# Patient Record
Sex: Male | Born: 1995 | Race: Black or African American | Hispanic: No | Smoking: Former smoker
Health system: Southern US, Community
[De-identification: ages and names within clinical notes are randomized; demographics above are authoritative.]

## PROBLEM LIST (undated history)

## (undated) ENCOUNTER — Emergency Department (HOSPITAL_COMMUNITY): Admission: EM | Payer: Self-pay | Source: Home / Self Care

## (undated) DIAGNOSIS — R51 Headache: Secondary | ICD-10-CM

## (undated) DIAGNOSIS — H539 Unspecified visual disturbance: Secondary | ICD-10-CM

## (undated) DIAGNOSIS — J45909 Unspecified asthma, uncomplicated: Secondary | ICD-10-CM

## (undated) DIAGNOSIS — K219 Gastro-esophageal reflux disease without esophagitis: Secondary | ICD-10-CM

## (undated) DIAGNOSIS — F419 Anxiety disorder, unspecified: Secondary | ICD-10-CM

## (undated) DIAGNOSIS — F909 Attention-deficit hyperactivity disorder, unspecified type: Secondary | ICD-10-CM

## (undated) DIAGNOSIS — R109 Unspecified abdominal pain: Secondary | ICD-10-CM

## (undated) DIAGNOSIS — T7840XA Allergy, unspecified, initial encounter: Secondary | ICD-10-CM

## (undated) HISTORY — DX: Gastro-esophageal reflux disease without esophagitis: K21.9

## (undated) HISTORY — DX: Unspecified abdominal pain: R10.9

---
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
## 2014-10-13 ENCOUNTER — Encounter (HOSPITAL_COMMUNITY): Payer: Self-pay | Admitting: Emergency Medicine

## 2014-10-13 ENCOUNTER — Emergency Department (INDEPENDENT_AMBULATORY_CARE_PROVIDER_SITE_OTHER): Payer: Medicaid Other

## 2014-10-13 ENCOUNTER — Emergency Department (INDEPENDENT_AMBULATORY_CARE_PROVIDER_SITE_OTHER)
Admission: EM | Admit: 2014-10-13 | Discharge: 2014-10-13 | Disposition: A | Discharge status: Home / Self Care | Payer: Medicaid Other | Source: Home / Self Care | Attending: Family Medicine | Admitting: Family Medicine

## 2014-10-13 DIAGNOSIS — J40 Bronchitis, not specified as acute or chronic: Secondary | ICD-10-CM

## 2014-10-13 MED ORDER — ALBUTEROL SULFATE HFA 108 (90 BASE) MCG/ACT IN AERS: 2.0000 | INHALATION_SPRAY | Freq: Four times a day (QID) | RESPIRATORY_TRACT | Status: DC | PRN

## 2014-10-13 MED ORDER — IPRATROPIUM-ALBUTEROL 0.5-2.5 (3) MG/3ML IN SOLN
RESPIRATORY_TRACT | Status: AC
  Filled 2014-10-13: qty 3

## 2014-10-13 MED ORDER — IPRATROPIUM-ALBUTEROL 0.5-2.5 (3) MG/3ML IN SOLN
3.0000 mL | Freq: Once | RESPIRATORY_TRACT | Status: AC
  Administered 2014-10-13: 3 mL via RESPIRATORY_TRACT

## 2014-10-13 MED ORDER — PREDNISONE 10 MG PO TABS: 30.0000 mg | ORAL_TABLET | Freq: Every day | ORAL | Status: DC

## 2014-10-13 NOTE — ED Notes (Signed)
C/o cold sx onset 6 days Sx include chest tightness, prod cough, chills, left ear pain Denies fevers Taking OTC cold meds  Alert, no signs of acute distress.

## 2014-10-13 NOTE — Discharge Instructions (Signed)
Thank you for coming in today. Take albuterol as needed. Take prednisone daily for 5 days. Return as needed.   Acute Bronchitis Bronchitis is inflammation of the airways that extend from the windpipe into the lungs (bronchi). The inflammation often causes mucus to develop. This leads to a cough, which is the most common symptom of bronchitis.  In acute bronchitis, the condition usually develops suddenly and goes away over time, usually in a couple weeks. Smoking, allergies, and asthma can make bronchitis worse. Repeated episodes of bronchitis may cause further lung problems.  CAUSES Acute bronchitis is most often caused by the same virus that causes a cold. The virus can spread from person to person (contagious) through coughing, sneezing, and touching contaminated objects. SIGNS AND SYMPTOMS   Cough.   Fever.   Coughing up mucus.   Body aches.   Chest congestion.   Chills.   Shortness of breath.   Sore throat.  DIAGNOSIS  Acute bronchitis is usually diagnosed through a physical exam. Your health care provider will also ask you questions about your medical history. Tests, such as chest X-rays, are sometimes done to rule out other conditions.  TREATMENT  Acute bronchitis usually goes away in a couple weeks. Oftentimes, no medical treatment is necessary. Medicines are sometimes given for relief of fever or cough. Antibiotic medicines are usually not needed but may be prescribed in certain situations. In some cases, an inhaler may be recommended to help reduce shortness of breath and control the cough. A cool mist vaporizer may also be used to help thin bronchial secretions and make it easier to clear the chest.  HOME CARE INSTRUCTIONS  Get plenty of rest.   Drink enough fluids to keep your urine clear or pale yellow (unless you have a medical condition that requires fluid restriction). Increasing fluids may help thin your respiratory secretions (sputum) and reduce chest  congestion, and it will prevent dehydration.   Take medicines only as directed by your health care provider.  If you were prescribed an antibiotic medicine, finish it all even if you start to feel better.  Avoid smoking and secondhand smoke. Exposure to cigarette smoke or irritating chemicals will make bronchitis worse. If you are a smoker, consider using nicotine gum or skin patches to help control withdrawal symptoms. Quitting smoking will help your lungs heal faster.   Reduce the chances of another bout of acute bronchitis by washing your hands frequently, avoiding people with cold symptoms, and trying not to touch your hands to your mouth, nose, or eyes.   Keep all follow-up visits as directed by your health care provider.  SEEK MEDICAL CARE IF: Your symptoms do not improve after 1 week of treatment.  SEEK IMMEDIATE MEDICAL CARE IF:  You develop an increased fever or chills.   You have chest pain.   You have severe shortness of breath.  You have bloody sputum.   You develop dehydration.  You faint or repeatedly feel like you are going to pass out.  You develop repeated vomiting.  You develop a severe headache. MAKE SURE YOU:   Understand these instructions.  Will watch your condition.  Will get help right away if you are not doing well or get worse. Document Released: 09/20/2004 Document Revised: 12/28/2013 Document Reviewed: 02/03/2013 [facility] Patient Information 2015 [place]Care, [place]. This information is not intended to replace advice given to you by your health care provider. Make sure you discuss any questions you have with your health care provider.

## 2014-10-13 NOTE — ED Provider Notes (Signed)
Sehaj Lendell CapriceCoker is a 19 y.o. male who presents to Urgent Care today for cough congestion chest tightness and mild wheezing present for about 5 days. Patient also notes left-sided ear pain. No vomiting or diarrhea. Cough is mildly productive. Over-the-counter medications have not helped much. Patient has a recent history of intentional overdose.   Past Medical History  Diagnosis Date  . Abdominal pain, recurrent   . Gastroesophageal reflux   . Asthma   . Anxiety   . Vision abnormalities     Wears glasses for reading  . ADHD (attention deficit hyperactivity disorder)   . Allergy   . Headache(784.0)     Takes Neurontin for miagraines   History reviewed. No pertinent past surgical history. History  Substance Use Topics  . Smoking status: Never Smoker   . Smokeless tobacco: Never Used  . Alcohol Use: No   ROS as above Medications: No current facility-administered medications for this encounter.   Current Outpatient Prescriptions  Medication Sig Dispense Refill  . albuterol (PROVENTIL HFA;VENTOLIN HFA) 108 (90 BASE) MCG/ACT inhaler Inhale 2 puffs into the lungs every 6 (six) hours as needed for wheezing or shortness of breath. 1 Inhaler 2  . gabapentin (NEURONTIN) 300 MG capsule Take 600 mg by mouth 2 (two) times daily.    Marland Kitchen. ibuprofen (ADVIL,MOTRIN) 200 MG tablet Take 200 mg by mouth every 6 (six) hours as needed for headache.    . mirtazapine (REMERON) 15 MG tablet Take 1 tablet (15 mg total) by mouth at bedtime. 30 tablet 1  . predniSONE (DELTASONE) 10 MG tablet Take 3 tablets (30 mg total) by mouth daily. 15 tablet 0  . risperiDONE (RISPERDAL) 1 MG tablet Take 1 mg by mouth at bedtime.     Allergies  Allergen Reactions  . Food Hives    Many: apple, watermelon, peach, corn, corn starch, cherries REACTION: ANAPHYLAXIS, soy, soy products, almonds, peanuts, hazel nuts, celery, carrots.      Exam:  BP 120/72 mmHg  Pulse 76  Temp(Src) 98.2 F (36.8 C) (Oral)  Resp 20  SpO2 94%   Oxygen saturation was rechecked and found to be 95% on room air Gen: Well NAD HEENT: EOMI,  MMM posterior pharynx with mild cobblestoning. Normal tympanic membranes bilaterally Lungs: Normal work of breathing. Coarse breath sounds bilaterally Heart: RRR no MRG Abd: NABS, Soft. Nondistended, Nontender Exts: Brisk capillary refill, warm and well perfused.   Patient was given a 2.5/0.5 mg DuoNeb nebulizer treatment, and felt better  No results found for this or any previous visit (from the past 24 hour(s)). Dg Chest 2 View  10/13/2014   CLINICAL DATA:  Cold, congestion, chest tightness  EXAM: CHEST  2 VIEW  COMPARISON:  04/19/2011  FINDINGS: Lungs are clear.  No pleural effusion or pneumothorax.  The heart is normal in size.  Visualized osseous structures are within normal limits.  IMPRESSION: Normal chest radiographs.   Electronically Signed   By: Charline BillsSriyesh  Krishnan M.D.   On: 10/13/2014 16:50    Assessment and Plan: 19 y.o. male with bronchitis.  Treat with prednisone and albuterol. Return as needed.  Discussed warning signs or symptoms. Please see discharge instructions. Patient expresses understanding.     Rodolph BongEvan S Corey, MD 10/13/14 [phone]

## 2015-07-25 IMAGING — DX DG CHEST 2V
2 series · 2 of 2 positions shown · non-contrast
Comparison: 04/19/2011

CLINICAL DATA: Cold, congestion, chest tightness

EXAM:
CHEST  2 VIEW

[chest lat (1 of 2)]
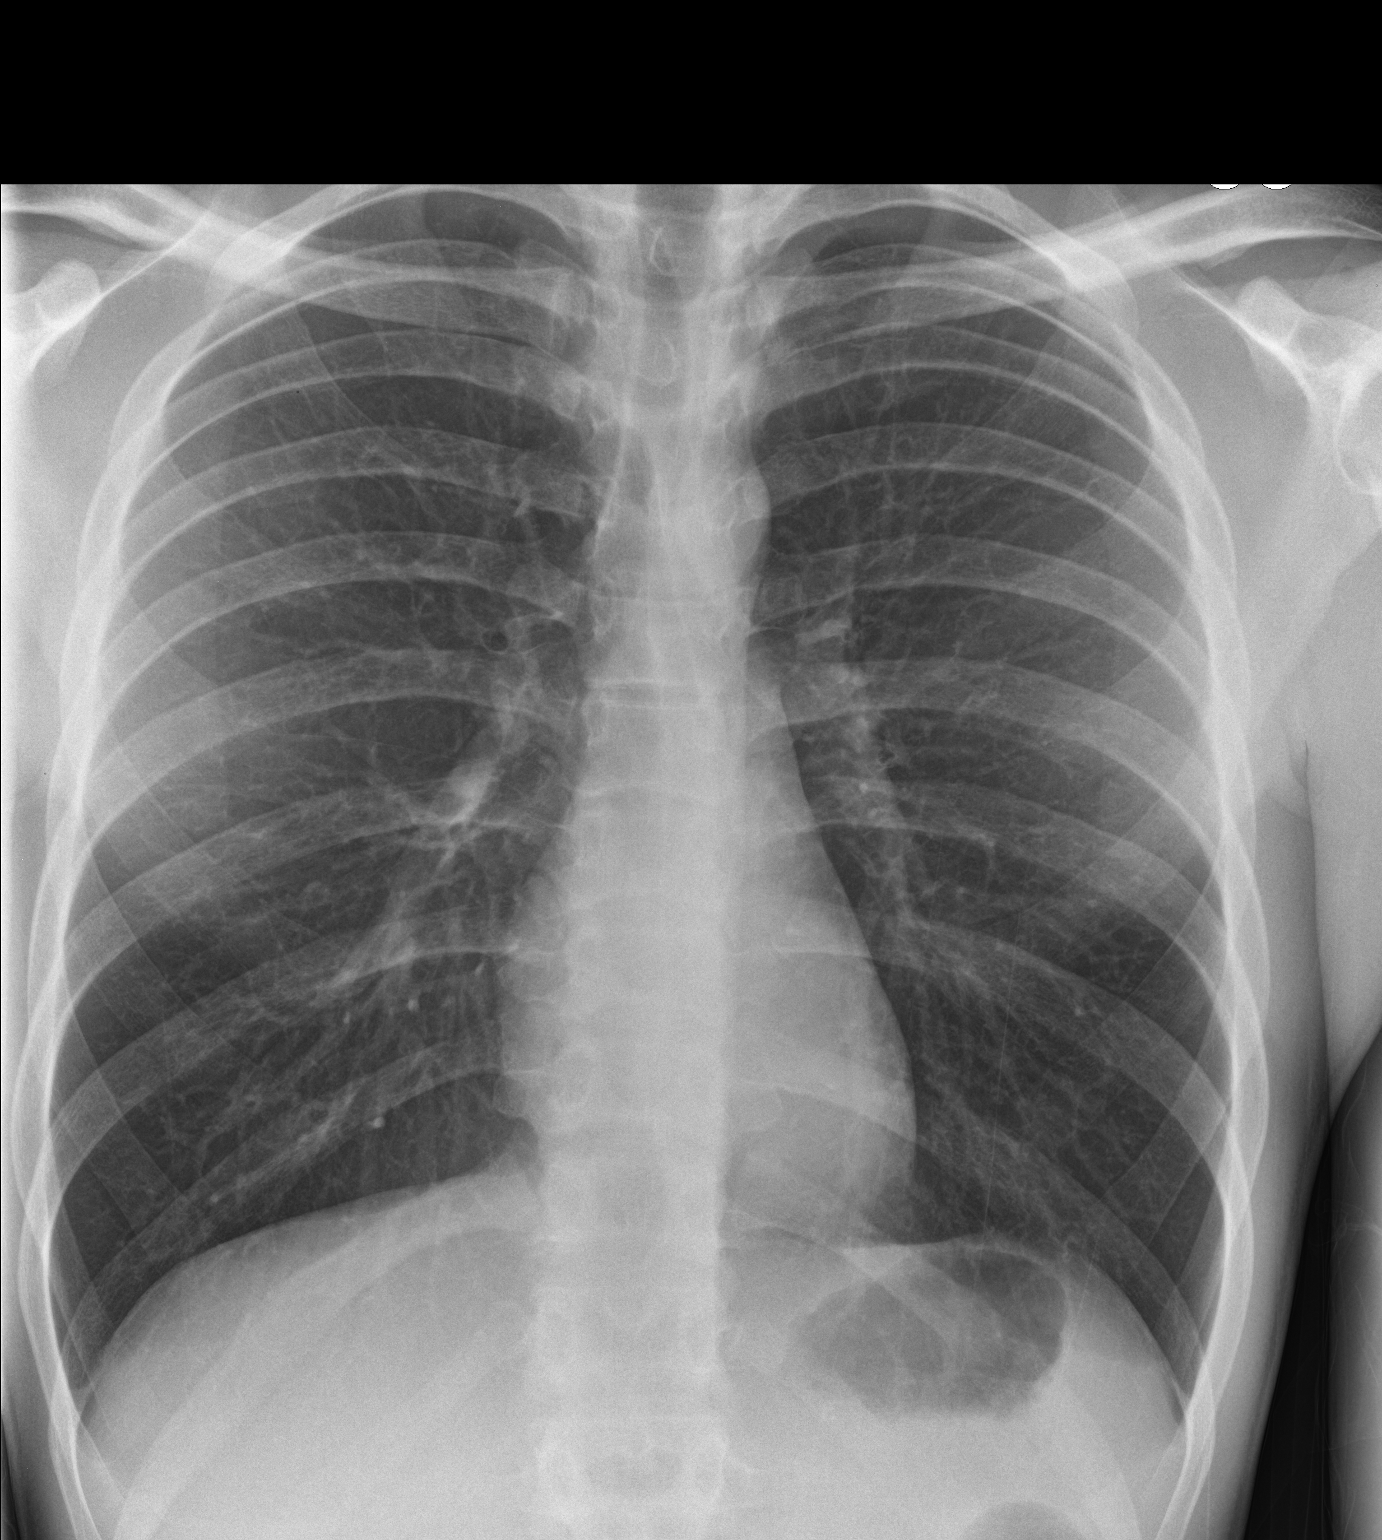

[chest lat (2 of 2)]
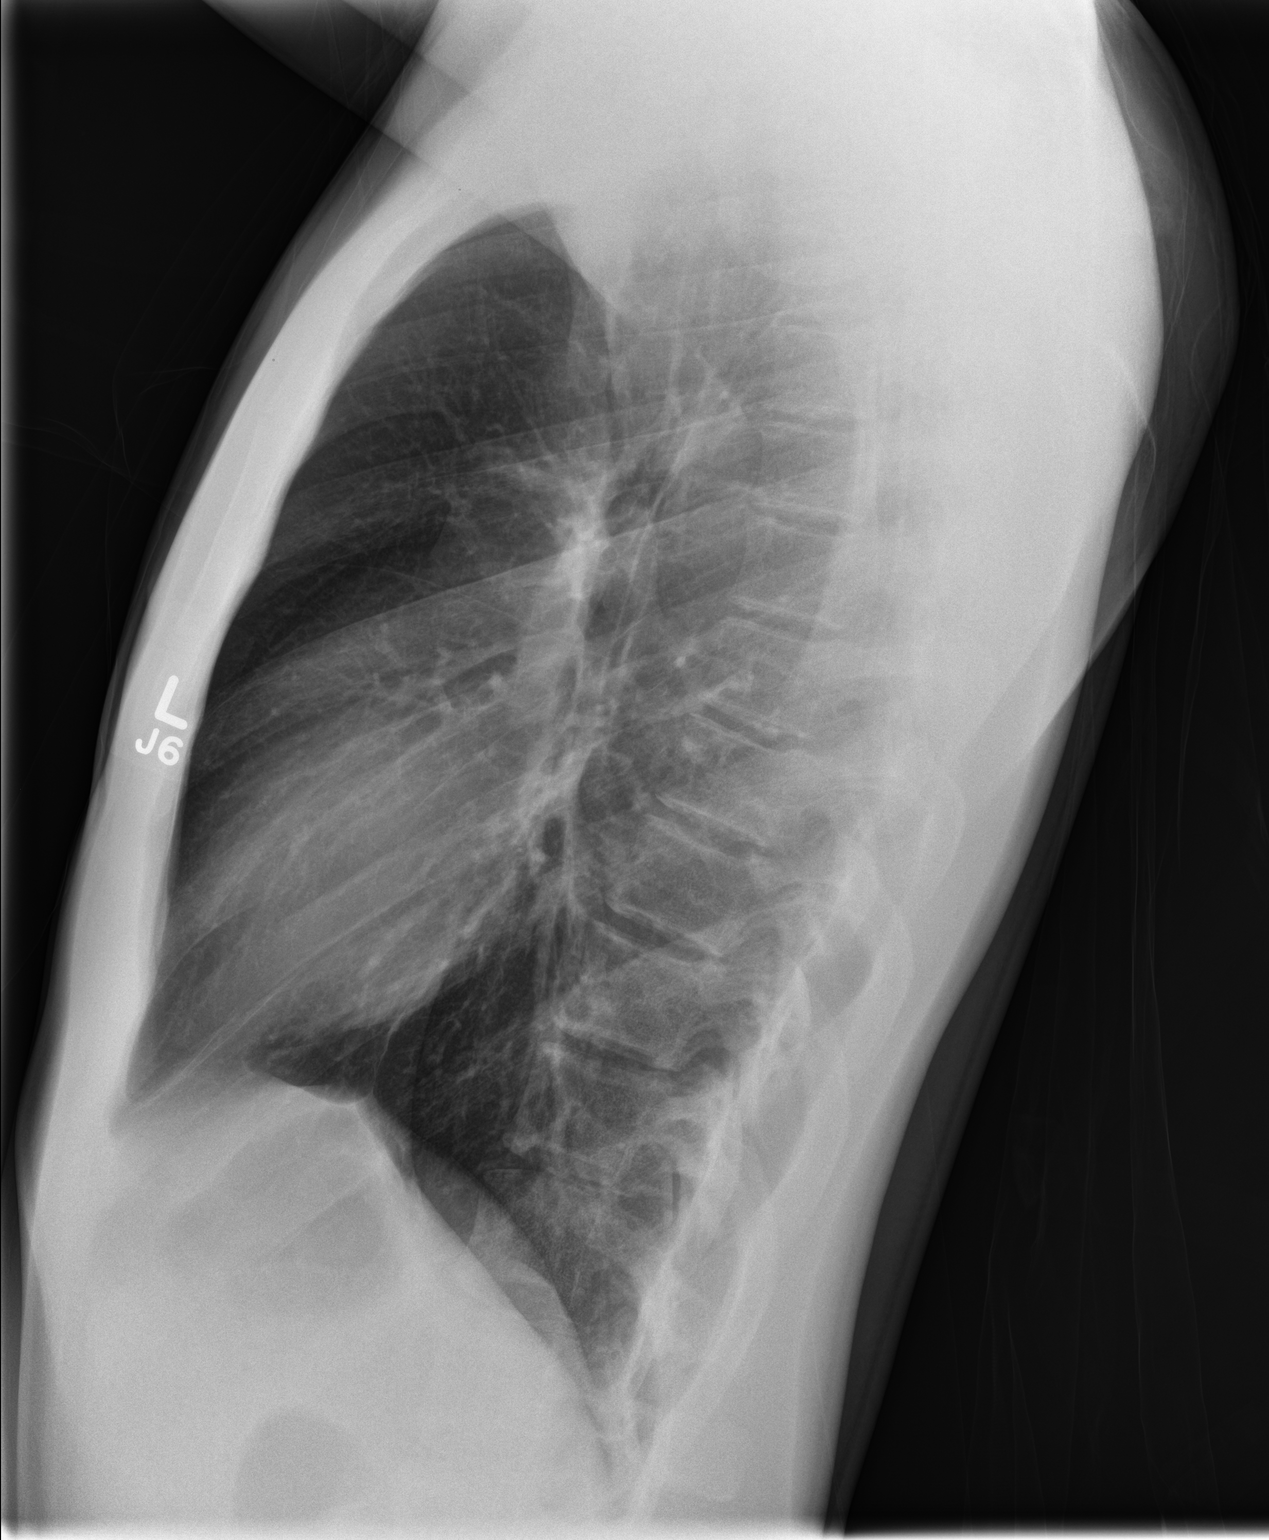

[2 of 2 positions shown; findings below may reference images not displayed]

FINDINGS: Lungs are clear.  No pleural effusion or pneumothorax.

The heart is normal in size.

Visualized osseous structures are within normal limits.
IMPRESSION: Normal chest radiographs.

## 2015-11-07 ENCOUNTER — Emergency Department
Admission: EM | Admit: 2015-11-07 | Discharge: 2015-11-07 | Disposition: A | Discharge status: Home / Self Care | Payer: Medicaid Other | Attending: Emergency Medicine | Admitting: Emergency Medicine

## 2015-11-07 ENCOUNTER — Encounter: Payer: Self-pay | Admitting: Emergency Medicine

## 2015-11-07 DIAGNOSIS — Z79899 Other long term (current) drug therapy: Secondary | ICD-10-CM | POA: Diagnosis not present

## 2015-11-07 DIAGNOSIS — K2970 Gastritis, unspecified, without bleeding: Secondary | ICD-10-CM | POA: Insufficient documentation

## 2015-11-07 DIAGNOSIS — R1012 Left upper quadrant pain: Secondary | ICD-10-CM

## 2015-11-07 DIAGNOSIS — R1030 Lower abdominal pain, unspecified: Secondary | ICD-10-CM | POA: Diagnosis present

## 2015-11-07 LAB — COMPREHENSIVE METABOLIC PANEL
ALBUMIN: 4.9 g/dL (ref 3.5–5.0)
ALK PHOS: 71 U/L (ref 38–126)
ALT: 16 U/L — ABNORMAL LOW (ref 17–63)
ANION GAP: 5 (ref 5–15)
AST: 23 U/L (ref 15–41)
BILIRUBIN TOTAL: 1.2 mg/dL (ref 0.3–1.2)
BUN: 23 mg/dL — AB (ref 6–20)
CO2: 30 mmol/L (ref 22–32)
Calcium: 9.2 mg/dL (ref 8.9–10.3)
Chloride: 104 mmol/L (ref 101–111)
Creatinine, Ser: 0.84 mg/dL (ref 0.61–1.24)
GFR calc Af Amer: >60 mL/min (ref >60)
GFR calc non Af Amer: >60 mL/min (ref >60)
GLUCOSE: 139 mg/dL — AB (ref 65–99)
Potassium: 4.1 mmol/L (ref 3.5–5.1)
Sodium: 139 mmol/L (ref 135–145)
TOTAL PROTEIN: 7.3 g/dL (ref 6.5–8.1)

## 2015-11-07 LAB — URINALYSIS COMPLETE WITH MICROSCOPIC (ARMC ONLY)
BILIRUBIN URINE: NEGATIVE
Bacteria, UA: NONE SEEN
Glucose, UA: 50 mg/dL — AB
HGB URINE DIPSTICK: NEGATIVE
KETONES UR: NEGATIVE mg/dL
LEUKOCYTES UA: NEGATIVE
Nitrite: NEGATIVE
PH: 5 (ref 5.0–8.0)
Protein, ur: NEGATIVE mg/dL
SPECIFIC GRAVITY, URINE: 1.044 — AB (ref 1.005–1.030)

## 2015-11-07 LAB — LIPASE, BLOOD: Lipase: 21 U/L (ref 11–51)

## 2015-11-07 LAB — CBC
HCT: 44.8 % (ref 40.0–52.0)
HEMOGLOBIN: 15 g/dL (ref 13.0–18.0)
MCH: 26.1 pg (ref 26.0–34.0)
MCHC: 33.5 g/dL (ref 32.0–36.0)
MCV: 78.1 fL — AB (ref 80.0–100.0)
PLATELETS: 172 10*3/uL (ref 150–440)
RBC: 5.73 MIL/uL (ref 4.40–5.90)
RDW: 15 % — ABNORMAL HIGH (ref 11.5–14.5)
WBC: 3.7 10*3/uL — ABNORMAL LOW (ref 3.8–10.6)

## 2015-11-07 MED ORDER — FAMOTIDINE 40 MG PO TABS: 40.0000 mg | ORAL_TABLET | Freq: Every evening | ORAL | Status: DC

## 2015-11-07 MED ORDER — GI COCKTAIL ~~LOC~~
30.0000 mL | Freq: Once | ORAL | Status: AC
  Administered 2015-11-07: 30 mL via ORAL
  Filled 2015-11-07: qty 30

## 2015-11-07 MED ORDER — SODIUM CHLORIDE 0.9 % IV BOLUS (SEPSIS)
1000.0000 mL | Freq: Once | INTRAVENOUS | Status: AC
  Administered 2015-11-07: 1000 mL via INTRAVENOUS

## 2015-11-07 NOTE — ED Notes (Signed)
Pt states one week of lower bilateral quadrant abdominal pain that radiates to flanks with associated nausea. Pt denies vomiting, diarrhea, or fever.

## 2015-11-07 NOTE — Discharge Instructions (Signed)
Abdominal Pain, Adult °Many things can cause abdominal pain. Usually, abdominal pain is not caused by a disease and will improve without treatment. It can often be observed and treated at home. Your health care provider will do a physical exam and possibly order blood tests and X-rays to help determine the seriousness of your pain. However, in many cases, more time must pass before a clear cause of the pain can be found. Before that point, your health care provider may not know if you need more testing or further treatment. °HOME CARE INSTRUCTIONS °Monitor your abdominal pain for any changes. The following actions may help to alleviate any discomfort you are experiencing: °· Only take over-the-counter or prescription medicines as directed by your health care provider. °· Do not take laxatives unless directed to do so by your health care provider. °· Try a clear liquid diet (broth, tea, or water) as directed by your health care provider. Slowly move to a bland diet as tolerated. °SEEK MEDICAL CARE IF: °· You have unexplained abdominal pain. °· You have abdominal pain associated with nausea or diarrhea. °· You have pain when you urinate or have a bowel movement. °· You experience abdominal pain that wakes you in the night. °· You have abdominal pain that is worsened or improved by eating food. °· You have abdominal pain that is worsened with eating fatty foods. °· You have a fever. °SEEK IMMEDIATE MEDICAL CARE IF: °· Your pain does not go away within 2 hours. °· You keep throwing up (vomiting). °· Your pain is felt only in portions of the abdomen, such as the right side or the left lower portion of the abdomen. °· You pass bloody or black tarry stools. °MAKE SURE YOU: °· Understand these instructions. °· Will watch your condition. °· Will get help right away if you are not doing well or get worse. °  °This information is not intended to replace advice given to you by your health care provider. Make sure you discuss  any questions you have with your health care provider. °  °Document Released: 05/23/2005 Document Revised: 05/04/2015 Document Reviewed: 04/22/2013 °Elsevier Interactive Patient Education ©2016 [employer] Inc. ° °Gastritis, Adult °Gastritis is soreness and swelling (inflammation) of the lining of the stomach. Gastritis can develop as a sudden onset (acute) or long-term (chronic) condition. If gastritis is not treated, it can lead to stomach bleeding and ulcers. °CAUSES  °Gastritis occurs when the stomach lining is weak or damaged. Digestive juices from the stomach then inflame the weakened stomach lining. The stomach lining may be weak or damaged due to viral or bacterial infections. One common bacterial infection is the Helicobacter pylori infection. Gastritis can also result from excessive alcohol consumption, taking certain medicines, or having too much acid in the stomach.  °SYMPTOMS  °In some cases, there are no symptoms. When symptoms are present, they may include: °· Pain or a burning sensation in the upper abdomen. °· Nausea. °· Vomiting. °· An uncomfortable feeling of fullness after eating. °DIAGNOSIS  °Your caregiver may suspect you have gastritis based on your symptoms and a physical exam. To determine the cause of your gastritis, your caregiver may perform the following: °· Blood or stool tests to check for the H pylori bacterium. °· Gastroscopy. A thin, flexible tube (endoscope) is passed down the esophagus and into the stomach. The endoscope has a light and camera on the end. Your caregiver uses the endoscope to view the inside of the stomach. °· Taking a tissue sample (  biopsy) from the stomach to examine under a microscope. °TREATMENT  °Depending on the cause of your gastritis, medicines may be prescribed. If you have a bacterial infection, such as an H pylori infection, antibiotics may be given. If your gastritis is caused by too much acid in the stomach, H2 blockers or antacids may be given. Your  caregiver may recommend that you stop taking aspirin, ibuprofen, or other nonsteroidal anti-inflammatory drugs (NSAIDs). °HOME CARE INSTRUCTIONS °· Only take over-the-counter or prescription medicines as directed by your caregiver. °· If you were given antibiotic medicines, take them as directed. Finish them even if you start to feel better. °· Drink enough fluids to keep your urine clear or pale yellow. °· Avoid foods and drinks that make your symptoms worse, such as: °¨ Caffeine or alcoholic drinks. °¨ Chocolate. °¨ Peppermint or mint flavorings. °¨ Garlic and onions. °¨ Spicy foods. °¨ Citrus fruits, such as oranges, lemons, or limes. °¨ Tomato-based foods such as sauce, chili, salsa, and pizza. °¨ Fried and fatty foods. °· Eat small, frequent meals instead of large meals. °SEEK IMMEDIATE MEDICAL CARE IF:  °· You have black or dark red stools. °· You vomit blood or material that looks like coffee grounds. °· You are unable to keep fluids down. °· Your abdominal pain gets worse. °· You have a fever. °· You do not feel better after 1 week. °· You have any other questions or concerns. °MAKE SURE YOU: °· Understand these instructions. °· Will watch your condition. °· Will get help right away if you are not doing well or get worse. °  °This information is not intended to replace advice given to you by your health care provider. Make sure you discuss any questions you have with your health care provider. °  °Document Released: 08/07/2001 Document Revised: 02/12/2012 Document Reviewed: 09/26/2011 °Elsevier Interactive Patient Education ©2016 Elsevier Inc. ° °

## 2015-11-07 NOTE — ED Provider Notes (Signed)
[facility] Emergency Department Provider Note  ____________________________________________  Time seen: Approximately 5:12 AM  I have reviewed the triage vital signs and the nursing notes.   HISTORY  Chief Complaint Abdominal Pain    HPI John Middleton is a 20 y.o. male who comes into the hospital today with abdominal pain and chest pain. The patient reports that he's feels a pulling in his lower abdomen. The patient reports it started last week Sunday and has been getting worse. The patient has been taking Tylenol and ibuprofen as well as some Pepto-Bismol but the medication has not been helping. The patient reports that the chest pain started on Monday the day after the abdominal pain. He reports that the symptoms come and goes but never goes away completely. The patient reports that the pain is worse in his left abdomen and goes up into his chest. He has never had this pain before. He rates the pain 8 out of 10 in intensity. The patient denies any vomiting or diarrhea but has had some nausea. He also denies any fevers but has had some chills. He does not have a primary care physician. He reports that Tylenol and ibuprofen have not been helping at all. He is here for treatment and evaluation.    Past Medical History  Diagnosis Date  . Abdominal pain, recurrent   . Gastroesophageal reflux   . Asthma   . Anxiety   . Vision abnormalities     Wears glasses for reading  . ADHD (attention deficit hyperactivity disorder)   . Allergy   . Headache(784.0)     Takes Neurontin for miagraines    Patient Active Problem List   Diagnosis Date Noted  . Renal insufficiency 09/01/2013  . Suicidal ideation 08/31/2013  . Tylenol overdose 08/31/2013  . Heart murmur 08/31/2013  . Stuttering 01/29/2013  . 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 Generalized anxiety disorder 01/24/2013  . Vomiting 09/04/2011  . Generalized  abdominal pain   . Gastroesophageal reflux     History reviewed. No pertinent past surgical history.  Current Outpatient Rx  Name  Route  Sig  Dispense  Refill  . cetirizine (ZYRTEC) 10 MG tablet   Oral   Take 10 mg by mouth daily.         Marland Kitchen. DM-Doxylamine-Acetaminophen 30-12.12-998 MG/30ML LIQD   Oral   Take 30 mLs by mouth as needed (cold symptoms).         Marland Kitchen. DM-Phenylephrine-Acetaminophen (VICKS DAYQUIL COLD & FLU) 10-5-325 MG/15ML LIQD   Oral   Take 30 mLs by mouth as needed (cold symptoms).         . famotidine (PEPCID) 40 MG tablet   Oral   Take 1 tablet (40 mg total) by mouth every evening.   15 tablet   0     Allergies Food  History reviewed. No pertinent family history.  Social History Social History  Substance Use Topics  . Smoking status: Never Smoker   . Smokeless tobacco: Never Used  . Alcohol Use: No    Review of Systems Constitutional: No fever/chills Eyes: No visual changes. ENT: No sore throat. Cardiovascular: Denies chest pain. Respiratory: Denies shortness of breath. Gastrointestinal: abdominal pain and nausea, no vomiting.  No diarrhea.   Genitourinary: Negative for dysuria. Musculoskeletal: Negative for back pain. Skin: Negative for rash. Neurological: Negative for headaches, focal weakness or numbness.  10-point ROS otherwise negative.  ____________________________________________   PHYSICAL EXAM:  VITAL SIGNS:  ED Triage Vitals  Enc Vitals Group     BP 11/07/15 0346 124/62 mmHg     Pulse Rate 11/07/15 0346 60     Resp 11/07/15 0346 14     Temp 11/07/15 0346 98.2 F (36.8 C)     Temp Source 11/07/15 0346 Oral     SpO2 11/07/15 0346 100 %     Weight 11/07/15 0346 141 lb (63.957 kg)     Height 11/07/15 0346  (1.803 m)     Head Cir --      Peak Flow --      Pain Score 11/07/15 0347 6     Pain Loc --      Pain Edu? --      Excl. in GC? --     Constitutional: Alert and oriented. Well appearing and in no acute  distress. Eyes: Conjunctivae are normal. PERRL. EOMI. Head: Atraumatic. Nose: No congestion/rhinnorhea. Mouth/Throat: Mucous membranes are moist.  Oropharynx non-erythematous. Cardiovascular: Normal rate, regular rhythm. Grossly normal heart sounds.  Good peripheral circulation. Respiratory: Normal respiratory effort.  No retractions. Lungs CTAB. Gastrointestinal: Softwith left upper quadrant tenderness to palpation. No distention.Positive bowel sounds  Musculoskeletal: No lower extremity tenderness nor edema.   Neurologic:  Normal speech and language.  Skin:  Skin is warm, dry and intact. Psychiatric: Mood and affect are normal.   ____________________________________________   LABS (all labs ordered are listed, but only abnormal results are displayed)  Labs Reviewed  COMPREHENSIVE METABOLIC PANEL - Abnormal; Notable for the following:    Glucose, Bld 139 (*)    BUN 23 (*)    ALT 16 (*)    All other components within normal limits  CBC - Abnormal; Notable for the following:    WBC 3.7 (*)    MCV 78.1 (*)    RDW 15.0 (*)    All other components within normal limits  URINALYSIS COMPLETEWITH MICROSCOPIC ([facility] ONLY) - Abnormal; Notable for the following:    Color, Urine YELLOW (*)    APPearance CLEAR (*)    Glucose, UA 50 (*)    Specific Gravity, Urine 1.044 (*)    Squamous Epithelial / LPF 0-5 (*)    All other components within normal limits  LIPASE, BLOOD   ____________________________________________  EKG  None  ____________________________________________  RADIOLOGY  None  ____________________________________________   PROCEDURES  Procedure(s) performed: None  Critical Care performed: No  ____________________________________________   INITIAL IMPRESSION / ASSESSMENT AND PLAN / ED COURSE  Pertinent labs & imaging results that were available during my care of the patient were reviewed by me and considered in my medical decision making (see chart for  details).  This is a 20 year old male who comes into the hospital with abdominal pain for 1 week. The patient reports the pain is localized in his left upper quadrant. He has pain all over but is mainly in that area. I'll give the patient a GI cocktail and reassess him. The patient's blood work is unremarkable at this time. The patient was sleeping well. He will be discharged. He will be encouraged to follow up with [facility] clinic. ____________________________________________   FINAL CLINICAL IMPRESSION(S) / ED DIAGNOSES  Final diagnoses:  Left upper quadrant pain  Gastritis      Rebecka Apley, MD 11/07/15 [phone]

## 2015-12-01 ENCOUNTER — Encounter (HOSPITAL_COMMUNITY): Payer: Self-pay | Admitting: *Deleted

## 2015-12-01 ENCOUNTER — Emergency Department (HOSPITAL_COMMUNITY)
Admission: EM | Admit: 2015-12-01 | Discharge: 2015-12-01 | Disposition: A | Discharge status: Home / Self Care | Payer: Medicaid Other | Attending: Emergency Medicine | Admitting: Emergency Medicine

## 2015-12-01 ENCOUNTER — Emergency Department (HOSPITAL_COMMUNITY): Payer: Medicaid Other

## 2015-12-01 DIAGNOSIS — Z8659 Personal history of other mental and behavioral disorders: Secondary | ICD-10-CM | POA: Insufficient documentation

## 2015-12-01 DIAGNOSIS — J069 Acute upper respiratory infection, unspecified: Secondary | ICD-10-CM | POA: Diagnosis not present

## 2015-12-01 DIAGNOSIS — R110 Nausea: Secondary | ICD-10-CM | POA: Insufficient documentation

## 2015-12-01 DIAGNOSIS — K219 Gastro-esophageal reflux disease without esophagitis: Secondary | ICD-10-CM | POA: Insufficient documentation

## 2015-12-01 DIAGNOSIS — J45909 Unspecified asthma, uncomplicated: Secondary | ICD-10-CM | POA: Diagnosis not present

## 2015-12-01 DIAGNOSIS — R05 Cough: Secondary | ICD-10-CM | POA: Diagnosis present

## 2015-12-01 DIAGNOSIS — Z8669 Personal history of other diseases of the nervous system and sense organs: Secondary | ICD-10-CM | POA: Diagnosis not present

## 2015-12-01 DIAGNOSIS — Z79899 Other long term (current) drug therapy: Secondary | ICD-10-CM | POA: Insufficient documentation

## 2015-12-01 DIAGNOSIS — R109 Unspecified abdominal pain: Secondary | ICD-10-CM | POA: Insufficient documentation

## 2015-12-01 DIAGNOSIS — B9789 Other viral agents as the cause of diseases classified elsewhere: Secondary | ICD-10-CM

## 2015-12-01 LAB — RAPID STREP SCREEN (MED CTR MEBANE ONLY): STREPTOCOCCUS, GROUP A SCREEN (DIRECT): NEGATIVE

## 2015-12-01 MED ORDER — GUAIFENESIN 100 MG/5ML PO LIQD: 100.0000 mg | Freq: Four times a day (QID) | ORAL | Status: DC | PRN

## 2015-12-01 MED ORDER — PROMETHAZINE-DM 6.25-15 MG/5ML PO SYRP: 5.0000 mL | ORAL_SOLUTION | Freq: Four times a day (QID) | ORAL | Status: DC | PRN

## 2015-12-01 NOTE — Discharge Instructions (Signed)
Viral Infections °A viral infection can be caused by different types of viruses. Most viral infections are not serious and resolve on their own. However, some infections may cause severe symptoms and may lead to further complications. °SYMPTOMS °Viruses can frequently cause: °· Minor sore throat. °· Aches and pains. °· Headaches. °· Runny nose. °· Different types of rashes. °· Watery eyes. °· Tiredness. °· Cough. °· Loss of appetite. °· Gastrointestinal infections, resulting in nausea, vomiting, and diarrhea. °These symptoms do not respond to antibiotics because the infection is not caused by bacteria. However, you might catch a bacterial infection following the viral infection. This is sometimes called a "superinfection." Symptoms of such a bacterial infection may include: °· Worsening sore throat with pus and difficulty swallowing. °· Swollen neck glands. °· Chills and a high or persistent fever. °· Severe headache. °· Tenderness over the sinuses. °· Persistent overall ill feeling (malaise), muscle aches, and tiredness (fatigue). °· Persistent cough. °· Yellow, green, or brown mucus production with coughing. °HOME CARE INSTRUCTIONS  °· Only take over-the-counter or prescription medicines for pain, discomfort, diarrhea, or fever as directed by your caregiver. °· Drink enough water and fluids to keep your urine clear or pale yellow. Sports drinks can provide valuable electrolytes, sugars, and hydration. °· Get plenty of rest and maintain proper nutrition. Soups and broths with crackers or rice are fine. °SEEK IMMEDIATE MEDICAL CARE IF:  °· You have severe headaches, shortness of breath, chest pain, neck pain, or an unusual rash. °· You have uncontrolled vomiting, diarrhea, or you are unable to keep down fluids. °· You or your child has an oral temperature above 102° F (38.9° C), not controlled by medicine. °· Your baby is older than 3 months with a rectal temperature of 102° F (38.9° C) or higher. °· Your baby is 3  months old or younger with a rectal temperature of 100.4° F (38° C) or higher. °MAKE SURE YOU:  °· Understand these instructions. °· Will watch your condition. °· Will get help right away if you are not doing well or get worse. °  °This information is not intended to replace advice given to you by your health care provider. Make sure you discuss any questions you have with your health care provider. °  °Document Released: 05/23/2005 Document Revised: 11/05/2011 Document Reviewed: 01/19/2015 °Elsevier Interactive Patient Education ©2016 [employer] ° °

## 2015-12-01 NOTE — ED Notes (Signed)
Pt c/o productive cough onset x 2 wks with no relief with OTC meds, pt reports yellow sputum, runny nose & sore throat, pt denies v/d, c/o nausea, A&O x4

## 2015-12-01 NOTE — ED Provider Notes (Signed)
CSN: [id number]     Arrival date & time 12/01/15  1132 History  By signing my name below, I, Ronney LionSuzanne Le, attest that this documentation has been prepared under the direction and in the presence of Fayrene HelperBowie [place] Flett, PA-C. Electronically Signed: Ronney LionSuzanne Le, ED Scribe. 12/01/2015. 12:33 PM.    Chief Complaint  Patient presents with  . Sore Throat  . Cough   The history is provided by the patient. No language interpreter was used.    HPI Comments: John Middleton is a 20 y.o. male with a history of asthma and allergy, who presents to the Emergency Department complaining of a gradual-onset, constant, worsening, productive cough that began 2 weeks ago. He complains of associated headache, chest pain occurring only with his cough, chest tightness, congestion, sneezing, abdominal cramping, nausea, and sore throat. Patient reports no known sick contact. No treatments or modifying factors were noted. He denies a history of smoking.  He denies fever, otalgia, rash, or vomiting.   Past Medical History  Diagnosis Date  . Abdominal pain, recurrent   . Gastroesophageal reflux   . Asthma   . Anxiety   . Vision abnormalities     Wears glasses for reading  . ADHD (attention deficit hyperactivity disorder)   . Allergy   . Headache(784.0)     Takes Neurontin for miagraines   History reviewed. No pertinent past surgical history. No family history on file. Social History  Substance Use Topics  . Smoking status: Never Smoker   . Smokeless tobacco: Never Used  . Alcohol Use: No    Review of Systems  Constitutional: Negative for fever.  HENT: Positive for congestion, sneezing and sore throat. Negative for ear pain.   Respiratory: Positive for cough and chest tightness.   Cardiovascular: Positive for chest pain.  Gastrointestinal: Positive for nausea and abdominal pain (cramping). Negative for vomiting.  Skin: Negative for rash.  Neurological: Positive for headaches.      Allergies  Food  Home  Medications   Prior to Admission medications   Medication Sig Start Date End Date Taking? Authorizing Provider  cetirizine (ZYRTEC) 10 MG tablet Take 10 mg by mouth daily.    Historical Provider, MD  DM-Doxylamine-Acetaminophen 30-12.12-998 MG/30ML LIQD Take 30 mLs by mouth as needed (cold symptoms).    Historical Provider, MD  DM-Phenylephrine-Acetaminophen (VICKS DAYQUIL COLD & FLU) 10-5-325 MG/15ML LIQD Take 30 mLs by mouth as needed (cold symptoms).    Historical Provider, MD  famotidine (PEPCID) 40 MG tablet Take 1 tablet (40 mg total) by mouth every evening. 11/07/15 11/06/16  Rebecka ApleyAllison P Webster, MD   BP 108/75 mmHg  Pulse 89  Temp(Src) 98.9 F (37.2 C) (Oral)  Resp 18  Ht 5\' 11"  (1.803 m)  SpO2 99% Physical Exam  Constitutional: He is oriented to person, place, and time. He appears well-developed and well-nourished. No distress.  HENT:  Head: Normocephalic and atraumatic.  Right Ear: Hearing, tympanic membrane, external ear and ear canal normal.  Left Ear: Hearing, tympanic membrane, external ear and ear canal normal.  Nose: Nose normal.  Mouth/Throat: Uvula is midline and oropharynx is clear and moist. No oropharyngeal exudate.  Ear, nose, and throat all appear normal.  Eyes: Conjunctivae and EOM are normal.  Neck: Neck supple. No tracheal deviation present.  Trachea is midline.  Cardiovascular: Normal rate, regular rhythm and normal heart sounds.  Exam reveals no gallop and no friction rub.   No murmur heard. Pulmonary/Chest: Effort normal. No respiratory distress. He has no wheezes.  He has no rales.  Musculoskeletal: Normal range of motion.  Neurological: He is alert and oriented to person, place, and time.  Skin: Skin is warm and dry.  Psychiatric: He has a normal mood and affect. His behavior is normal.  Nursing note and vitals reviewed.  ED Course  Procedures (including critical care time)  DIAGNOSTIC STUDIES: Oxygen Saturation is 99% on RA, normal by my  interpretation.    COORDINATION OF CARE: 12:17 PM - Discussed treatment plan with pt at bedside which includes awaiting CXR results. Pt verbalized understanding and agreed to plan.    Labs Review Labs Reviewed  RAPID STREP SCREEN (NOT AT [facility])  CULTURE, GROUP A STREP [facility])    Imaging Review Dg Chest 2 View  12/01/2015  CLINICAL DATA:  Cough and body aches for 2 weeks.  Sore throat EXAM: CHEST  2 VIEW COMPARISON:  10/13/2014 FINDINGS: The heart size and mediastinal contours are within normal limits. Both lungs are clear. The visualized skeletal structures are unremarkable. IMPRESSION: No active cardiopulmonary disease. Electronically Signed   By: Signa Kell M.D.   On: 12/01/2015 12:21   I have personally reviewed and evaluated these images and lab results as part of my medical decision-making.  MDM   Final diagnoses:  Viral upper respiratory tract infection with cough   Pt CXR negative for acute infiltrate. Patients symptoms are consistent with URI, likely viral etiology. Discussed that antibiotics are not indicated for viral infections. Pt will be discharged with symptomatic treatment (Robitussin, Promethazine-DM).  Verbalizes understanding and is agreeable with plan. Pt is hemodynamically stable & in NAD prior to dc.   BP 108/75 mmHg  Pulse 89  Temp(Src) 98.9 F (37.2 C) (Oral)  Resp 18  Ht  (1.803 m)  SpO2 99%  I personally performed the services described in this documentation, which was scribed in my presence. The recorded information has been reviewed and is accurate.       Fayrene Helper, PA-C 12/01/15 1537  Benjiman Core, MD 12/02/15 [phone]

## 2015-12-03 LAB — CULTURE, GROUP A STREP (THRC)

## 2016-09-07 ENCOUNTER — Encounter (HOSPITAL_COMMUNITY): Payer: Self-pay

## 2016-09-07 ENCOUNTER — Emergency Department (HOSPITAL_COMMUNITY)
Admission: EM | Admit: 2016-09-07 | Discharge: 2016-09-07 | Disposition: A | Discharge status: Home / Self Care | Payer: Self-pay | Attending: Dermatology | Admitting: Dermatology

## 2016-09-07 DIAGNOSIS — F17210 Nicotine dependence, cigarettes, uncomplicated: Secondary | ICD-10-CM | POA: Insufficient documentation

## 2016-09-07 DIAGNOSIS — F909 Attention-deficit hyperactivity disorder, unspecified type: Secondary | ICD-10-CM | POA: Insufficient documentation

## 2016-09-07 DIAGNOSIS — Z5321 Procedure and treatment not carried out due to patient leaving prior to being seen by health care provider: Secondary | ICD-10-CM | POA: Insufficient documentation

## 2016-09-07 DIAGNOSIS — J45909 Unspecified asthma, uncomplicated: Secondary | ICD-10-CM | POA: Insufficient documentation

## 2016-09-07 DIAGNOSIS — R599 Enlarged lymph nodes, unspecified: Secondary | ICD-10-CM | POA: Insufficient documentation

## 2016-09-07 NOTE — ED Triage Notes (Signed)
Per pT, Pt had the flu a couple weeks ago that he has gotten over. Pt reports a swollen lymph node under the left chin that has persisted over the last three days.

## 2016-09-07 NOTE — ED Notes (Signed)
Pt reports having intermittent suicidal thoughts due to depression in the past. Denies any thoughts today, but would like to be given some resources for a counselor.

## 2016-09-07 NOTE — ED Notes (Signed)
called pt multiple times with no answer.

## 2016-09-09 ENCOUNTER — Encounter (HOSPITAL_COMMUNITY): Payer: Self-pay | Admitting: *Deleted

## 2016-09-11 IMAGING — CR DG CHEST 2V
2 series · 2 of 2 positions shown · non-contrast
Comparison: 10/13/2014

CLINICAL DATA: Cough and body aches for 2 weeks.  Sore throat

EXAM:
CHEST  2 VIEW

[chest pa]
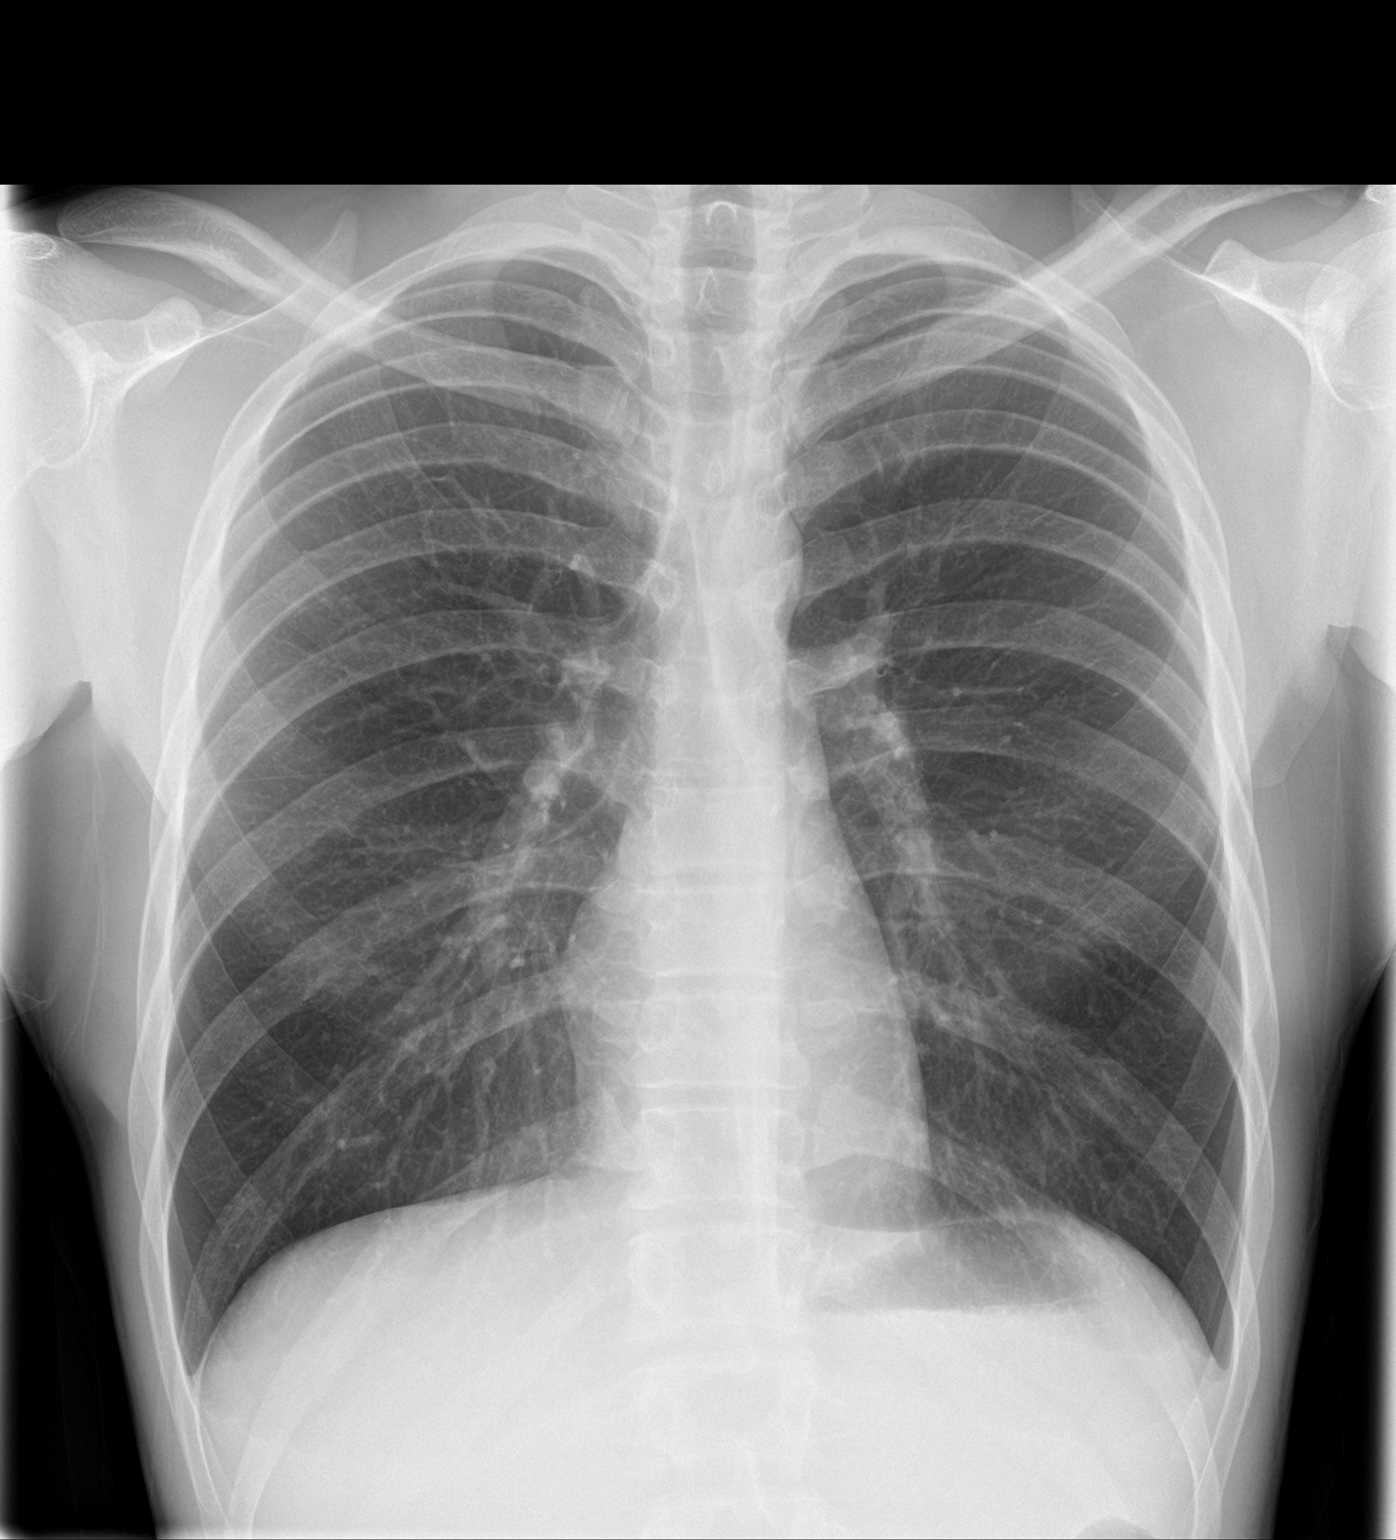

[chest lat]
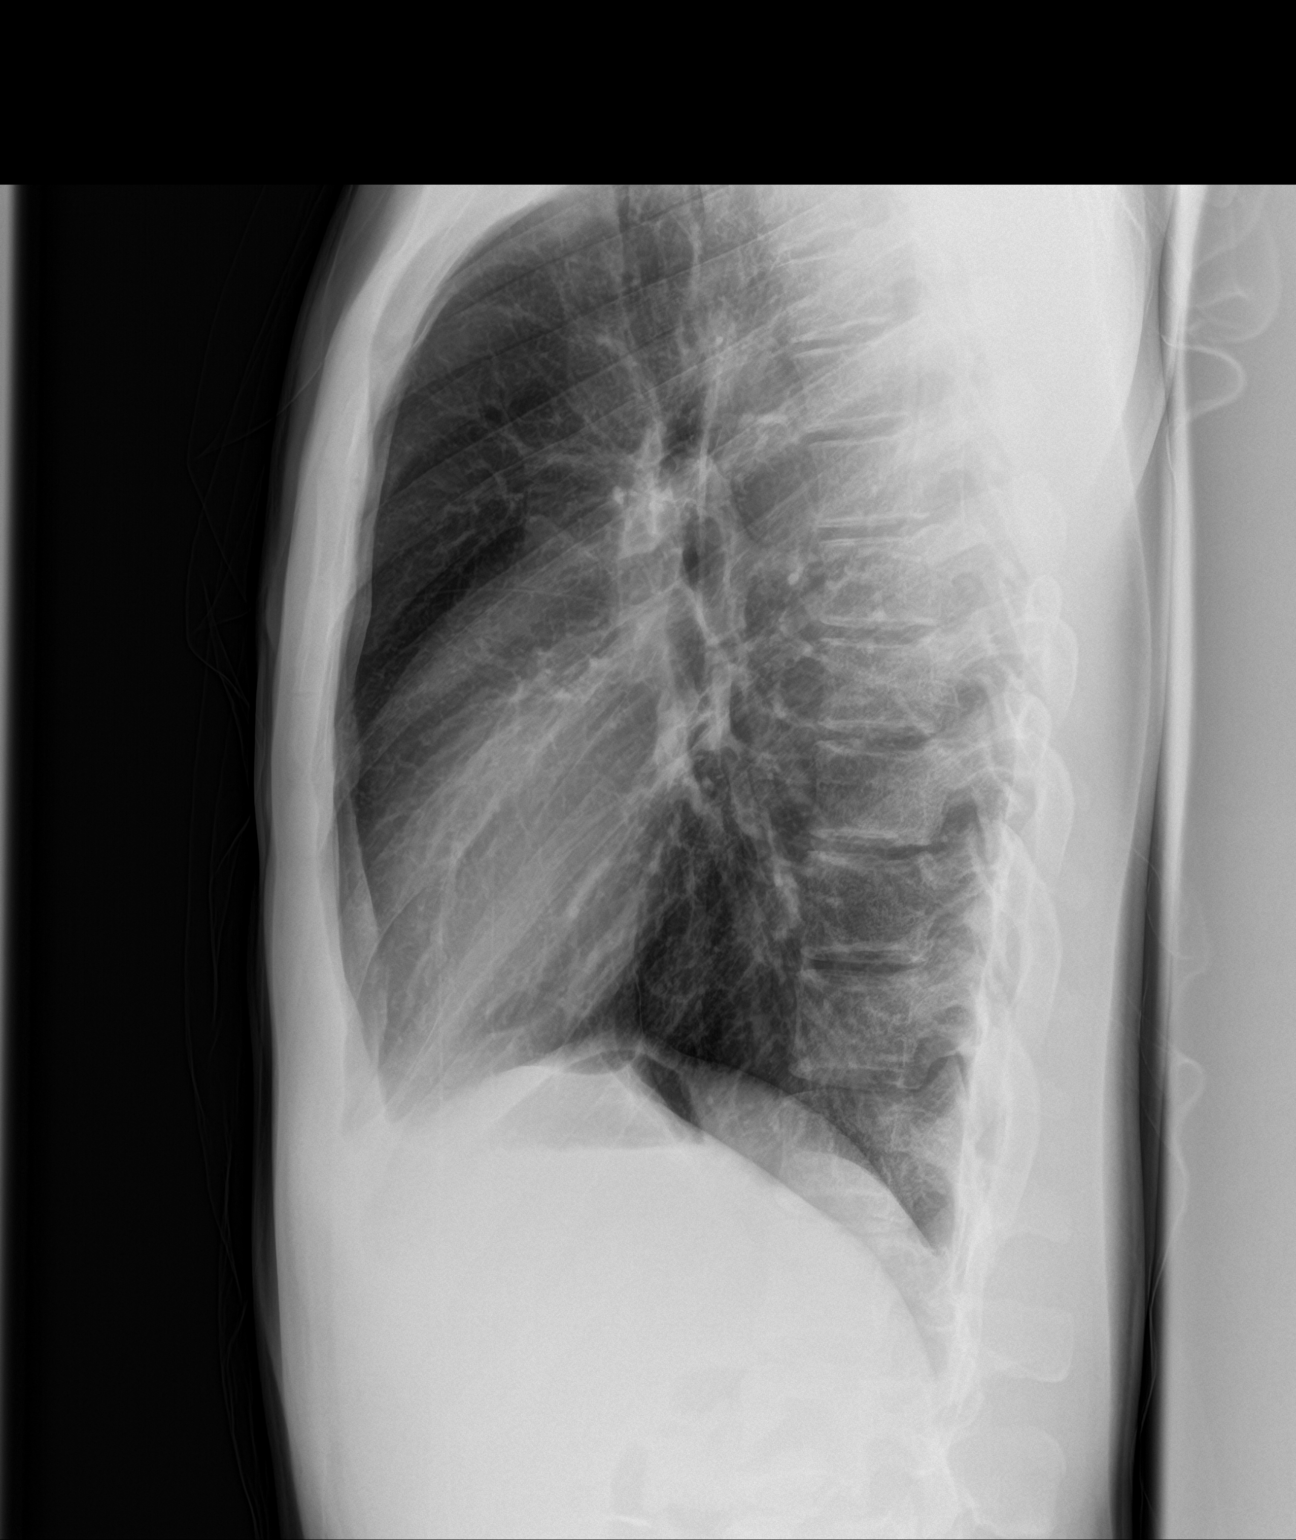

[2 of 2 positions shown; findings below may reference images not displayed]

FINDINGS: The heart size and mediastinal contours are within normal limits.
Both lungs are clear. The visualized skeletal structures are
unremarkable.
IMPRESSION: No active cardiopulmonary disease.

## 2017-07-30 ENCOUNTER — Encounter (HOSPITAL_COMMUNITY): Payer: Self-pay | Admitting: *Deleted

## 2017-07-30 ENCOUNTER — Emergency Department (HOSPITAL_COMMUNITY)
Admission: EM | Admit: 2017-07-30 | Discharge: 2017-07-30 | Disposition: A | Discharge status: Home / Self Care | Payer: Self-pay | Attending: Emergency Medicine | Admitting: Emergency Medicine

## 2017-07-30 ENCOUNTER — Other Ambulatory Visit: Payer: Self-pay

## 2017-07-30 DIAGNOSIS — J45909 Unspecified asthma, uncomplicated: Secondary | ICD-10-CM | POA: Insufficient documentation

## 2017-07-30 DIAGNOSIS — Z79899 Other long term (current) drug therapy: Secondary | ICD-10-CM | POA: Insufficient documentation

## 2017-07-30 DIAGNOSIS — K2970 Gastritis, unspecified, without bleeding: Secondary | ICD-10-CM | POA: Insufficient documentation

## 2017-07-30 DIAGNOSIS — F17210 Nicotine dependence, cigarettes, uncomplicated: Secondary | ICD-10-CM | POA: Insufficient documentation

## 2017-07-30 LAB — COMPREHENSIVE METABOLIC PANEL
ALK PHOS: 61 U/L (ref 38–126)
ALT: 11 U/L — ABNORMAL LOW (ref 17–63)
ANION GAP: 9 (ref 5–15)
AST: 18 U/L (ref 15–41)
Albumin: 4.1 g/dL (ref 3.5–5.0)
BILIRUBIN TOTAL: 0.9 mg/dL (ref 0.3–1.2)
BUN: 13 mg/dL (ref 6–20)
CALCIUM: 9.2 mg/dL (ref 8.9–10.3)
CO2: 24 mmol/L (ref 22–32)
Chloride: 107 mmol/L (ref 101–111)
Creatinine, Ser: 0.84 mg/dL (ref 0.61–1.24)
GFR calc non Af Amer: >60 mL/min (ref >60)
Glucose, Bld: 100 mg/dL — ABNORMAL HIGH (ref 65–99)
POTASSIUM: 4.1 mmol/L (ref 3.5–5.1)
Sodium: 140 mmol/L (ref 135–145)
TOTAL PROTEIN: 6.8 g/dL (ref 6.5–8.1)

## 2017-07-30 LAB — LIPASE, BLOOD: Lipase: 26 U/L (ref 11–51)

## 2017-07-30 LAB — URINALYSIS, ROUTINE W REFLEX MICROSCOPIC
Bilirubin Urine: NEGATIVE
Glucose, UA: NEGATIVE mg/dL
Hgb urine dipstick: NEGATIVE
KETONES UR: NEGATIVE mg/dL
LEUKOCYTES UA: NEGATIVE
NITRITE: NEGATIVE
PROTEIN: NEGATIVE mg/dL
Specific Gravity, Urine: 1.021 (ref 1.005–1.030)
pH: 7 (ref 5.0–8.0)

## 2017-07-30 LAB — CBC
HCT: 45.9 % (ref 39.0–52.0)
HEMOGLOBIN: 15.3 g/dL (ref 13.0–17.0)
MCH: 25.9 pg — ABNORMAL LOW (ref 26.0–34.0)
MCHC: 33.3 g/dL (ref 30.0–36.0)
MCV: 77.8 fL — ABNORMAL LOW (ref 78.0–100.0)
Platelets: 178 10*3/uL (ref 150–400)
RBC: 5.9 MIL/uL — AB (ref 4.22–5.81)
RDW: 14.2 % (ref 11.5–15.5)
WBC: 2.9 10*3/uL — ABNORMAL LOW (ref 4.0–10.5)

## 2017-07-30 MED ORDER — GI COCKTAIL ~~LOC~~
30.0000 mL | Freq: Once | ORAL | Status: AC
  Administered 2017-07-30: 30 mL via ORAL
  Filled 2017-07-30: qty 30

## 2017-07-30 MED ORDER — FAMOTIDINE 20 MG PO TABS: 20.0000 mg | ORAL_TABLET | Freq: Two times a day (BID) | ORAL | 0 refills | Status: DC

## 2017-07-30 NOTE — ED Provider Notes (Signed)
[facility] Provider Note   CSN: [id number] Arrival date & time: 07/30/17  [id number]     History   Chief Complaint Chief Complaint  Patient presents with  . Abdominal Pain    HPI John Middleton is a 21 y.o. male.  21 year old male presents with complaints of epigastric burning discomfort.  Patient reports prior history of GERD.  Patient reports that his symptoms started over the last 2-3 days.  Patient denies fever, nausea, vomiting, back pain, or other complaint.  Patient took Tums at home without significant improvement.   The history is provided by the patient.  Abdominal Pain   This is a new problem. The current episode started more than 2 days ago. The problem occurs daily. The problem has not changed since onset.The pain is associated with alcohol use. The pain is located in the epigastric region. The quality of the pain is aching and burning. The pain is mild. Nothing aggravates the symptoms. Nothing relieves the symptoms.    Past Medical History:  Diagnosis Date  . Abdominal pain, recurrent   . ADHD (attention deficit hyperactivity disorder)   . Allergy   . Anxiety   . Asthma   . Gastroesophageal reflux   . Headache(784.0)    Takes Neurontin for miagraines  . Vision abnormalities    Wears glasses for reading    Patient Active Problem List   Diagnosis Date Noted  . Renal insufficiency 09/01/2013  . Suicidal ideation 08/31/2013  . Tylenol overdose 08/31/2013  . Heart murmur 08/31/2013  . Stuttering 01/29/2013  . 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 Generalized anxiety disorder 01/24/2013  . Vomiting 09/04/2011  . Generalized abdominal pain   . Gastroesophageal reflux     History reviewed. No pertinent surgical history.     Home Medications    Prior to Admission medications   Medication Sig Start Date End Date Taking? Authorizing Provider  cetirizine (ZYRTEC) 10 MG  tablet Take 10 mg by mouth daily.   Yes [provider]  DM-Doxylamine-Acetaminophen 30-12.12-998 MG/30ML LIQD Take 30 mLs by mouth as needed (cold symptoms).   Yes [provider]  famotidine (PEPCID) 40 MG tablet Take 1 tablet (40 mg total) by mouth every evening. 11/07/15 07/30/17 Yes Rebecka ApleyWebster, Allison P, MD  Homeopathic Products [facility](SIMILASAN STYE EYE RELIEF OP) Apply 1 application to eye as needed (for eye irritation left eye).   Yes [provider]  famotidine (PEPCID) 20 MG tablet Take 1 tablet (20 mg total) by mouth 2 (two) times daily. 07/30/17   Wynetta FinesMessick, Peter C, MD  guaiFENesin (ROBITUSSIN) 100 MG/5ML liquid Take 5-10 mLs (100-200 mg total) by mouth 4 (four) times daily as needed for congestion. Patient not taking: Reported on 07/30/2017 12/01/15   Fayrene Helperran, [place], PA-C  promethazine-dextromethorphan (PROMETHAZINE-DM) 6.25-15 MG/5ML syrup Take 5 mLs by mouth 4 (four) times daily as needed for cough. Patient not taking: Reported on 07/30/2017 12/01/15   Fayrene Helperran, [place], PA-C    Family History History reviewed. No pertinent family history.  Social History Social History   Tobacco Use  . Smoking status: Current Every Day Smoker    Packs/day: 0.20    Types: Cigarettes  . Smokeless tobacco: Never Used  Substance Use Topics  . Alcohol use: Yes  . Drug use: No     Allergies   Food and Ibuprofen   Review of Systems Review of Systems  Gastrointestinal: Positive for abdominal pain.  All other systems reviewed  and are negative.    Physical Exam Updated Vital Signs BP 118/80 (BP Location: Right Arm)   Pulse (!) 59   Temp 98.1 F (36.7 C) (Oral)   Resp 17   SpO2 99%   Physical Exam  Constitutional: He is oriented to person, place, and time. He appears well-developed and well-nourished. No distress.  HENT:  Head: Normocephalic and atraumatic.  Mouth/Throat: Oropharynx is clear and moist.  Eyes: Conjunctivae and EOM are normal. Pupils are equal, round, and  reactive to light.  Neck: Normal range of motion. Neck supple.  Cardiovascular: Normal rate, regular rhythm and normal heart sounds.  Pulmonary/Chest: Effort normal and breath sounds normal. No respiratory distress.  Abdominal: Soft. He exhibits no distension. There is no tenderness.  Musculoskeletal: Normal range of motion. He exhibits no edema or deformity.  Neurological: He is alert and oriented to person, place, and time.  Skin: Skin is warm and dry.  Psychiatric: He has a normal mood and affect.  Nursing note and vitals reviewed.    ED Treatments / Results  Labs (all labs ordered are listed, but only abnormal results are displayed) Labs Reviewed  COMPREHENSIVE METABOLIC PANEL - Abnormal; Notable for the following components:      Result Value   Glucose, Bld 100 (*)    ALT 11 (*)    All other components within normal limits  CBC - Abnormal; Notable for the following components:   WBC 2.9 (*)    RBC 5.90 (*)    MCV 77.8 (*)    MCH 25.9 (*)    All other components within normal limits  LIPASE, BLOOD  URINALYSIS, ROUTINE W REFLEX MICROSCOPIC    EKG  EKG Interpretation None       Radiology No results found.  Procedures Procedures (including critical care time)  Medications Ordered in ED Medications  gi cocktail (Maalox,Lidocaine,Donnatal) (30 mLs Oral Given 07/30/17 1325)     Initial Impression / Assessment and Plan / ED Course  I have reviewed the triage vital signs and the nursing notes.  Pertinent labs & imaging results that were available during my care of the patient were reviewed by me and considered in my medical decision making (see chart for details).     MSE complete  History and exam are consistent with gastritis.  He is improved following administration of GI cocktail.  Patient understands need for close follow-up.  Patient desires discharge home.  Final Clinical Impressions(s) / ED Diagnoses   Final diagnoses:  Gastritis without bleeding,  unspecified chronicity, unspecified gastritis type    ED Discharge Orders        Ordered    famotidine (PEPCID) 20 MG tablet  2 times daily     07/30/17 1453       Wynetta FinesMessick, Peter C, MD 07/30/17 1510

## 2017-07-30 NOTE — ED Triage Notes (Signed)
Pt reports having acid reflux, hx of same. Having burning pain to abd, chest, and throat x 2 days. Minimal relief with pepto and tums at home. No distress is noted at triage.

## 2021-10-02 ENCOUNTER — Other Ambulatory Visit: Payer: Self-pay

## 2021-10-02 ENCOUNTER — Ambulatory Visit (HOSPITAL_COMMUNITY)
Admission: EM | Admit: 2021-10-02 | Discharge: 2021-10-02 | Disposition: A | Discharge status: Home / Self Care | Payer: Self-pay | Attending: Family Medicine | Admitting: Family Medicine

## 2021-10-02 ENCOUNTER — Encounter (HOSPITAL_COMMUNITY): Payer: Self-pay | Admitting: *Deleted

## 2021-10-02 DIAGNOSIS — Z20822 Contact with and (suspected) exposure to covid-19: Secondary | ICD-10-CM | POA: Insufficient documentation

## 2021-10-02 DIAGNOSIS — J069 Acute upper respiratory infection, unspecified: Secondary | ICD-10-CM | POA: Insufficient documentation

## 2021-10-02 DIAGNOSIS — Z0279 Encounter for issue of other medical certificate: Secondary | ICD-10-CM | POA: Insufficient documentation

## 2021-10-02 DIAGNOSIS — J45909 Unspecified asthma, uncomplicated: Secondary | ICD-10-CM | POA: Insufficient documentation

## 2021-10-02 DIAGNOSIS — Z8616 Personal history of COVID-19: Secondary | ICD-10-CM | POA: Insufficient documentation

## 2021-10-02 LAB — SARS CORONAVIRUS 2 (TAT 6-24 HRS): SARS Coronavirus 2: NEGATIVE

## 2021-10-02 NOTE — ED Triage Notes (Signed)
PT did a home test for COVID

## 2021-10-02 NOTE — Discharge Instructions (Addendum)
°  You have been swabbed for COVID, and the test will result in the next 24 hours. Our staff will call you if positive. If the test is positive, you should quarantine for 5 days.  °

## 2021-10-02 NOTE — ED Triage Notes (Signed)
Pt reports having COVID  09-16-21 and needs to be cleared to return to work.

## 2021-10-02 NOTE — ED Provider Notes (Addendum)
[place]    CSN: [id number] Arrival date & time: 10/02/21  1200      History   Chief Complaint Chief Complaint  Patient presents with   clear to return to work    HPI John Middleton is a 26 y.o. male.   HPI Here for turn to work clearance.  On January 21 patient tested positive for COVID.  She had had some extra wheezing with her asthma and chest congestion during that time.  Now her employer is requiring that she have proof that she has recovered from [place].  Currently she is having no fever, cough, or wheezing.  She feels good  Past Medical History:  Diagnosis Date   Abdominal pain, recurrent    ADHD (attention deficit hyperactivity disorder)    Allergy    Anxiety    Asthma    Gastroesophageal reflux    Headache(784.0)    Takes Neurontin for miagraines   Vision abnormalities    Wears glasses for reading    Patient Active Problem List   Diagnosis Date Noted   Renal insufficiency 09/01/2013   Suicidal ideation 08/31/2013   Tylenol overdose 08/31/2013   Heart murmur 08/31/2013   Stuttering 01/29/2013   MDD (major depressive disorder), recurrent, severe, with psychosis ([place]) 01/24/2013   Generalized anxiety disorder 01/24/2013   Vomiting 09/04/2011   Generalized abdominal pain    Gastroesophageal reflux     History reviewed. No pertinent surgical history.     Home Medications    Prior to Admission medications   Medication Sig Start Date End Date Taking? Authorizing Provider  Homeopathic Products [facility] STYE EYE RELIEF OP) Apply 1 application to eye as needed (for eye irritation left eye).    [provider]    Family History History reviewed. No pertinent family history.  Social History Social History   Tobacco Use   Smoking status: Every Day    Packs/day: 0.20    Types: Cigarettes   Smokeless tobacco: Never  Substance Use Topics   Alcohol use: Yes   Drug use: No     Allergies   Food and Ibuprofen   Review  of Systems Review of Systems   Physical Exam Triage Vital Signs ED Triage Vitals  Enc Vitals Group     BP 10/02/21 1350 107/68     Pulse Rate 10/02/21 1350 61     Resp 10/02/21 1350 18     Temp 10/02/21 1350 99.1 F (37.3 C)     Temp src --      SpO2 10/02/21 1350 95 %     Weight --      Height --      Head Circumference --      Peak Flow --      Pain Score 10/02/21 1347 0     Pain Loc --      Pain Edu? --      Excl. in [place]? --    No data found.  Updated Vital Signs BP 107/68    Pulse 61    Temp 99.1 F (37.3 C)    Resp 18    SpO2 95%   Visual Acuity Right Eye Distance:   Left Eye Distance:   Bilateral Distance:    Right Eye Near:   Left Eye Near:    Bilateral Near:     Physical Exam Vitals reviewed.  Constitutional:      General: He is not in acute distress.    Appearance: He is not  toxic-appearing.  HENT:     Nose: Nose normal.     Mouth/Throat:     Mouth: Mucous membranes are moist.     Pharynx: No oropharyngeal exudate or posterior oropharyngeal erythema.  Eyes:     Extraocular Movements: Extraocular movements intact.     Pupils: Pupils are equal, round, and reactive to light.  Cardiovascular:     Rate and Rhythm: Normal rate and regular rhythm.     Heart sounds: No murmur heard. Pulmonary:     Effort: Pulmonary effort is normal. No respiratory distress.     Breath sounds: No stridor. No wheezing, rhonchi or rales.  Musculoskeletal:     Cervical back: Neck supple. No rigidity.  Lymphadenopathy:     Cervical: No cervical adenopathy.  Skin:    Capillary Refill: Capillary refill takes less than 2 seconds.     Coloration: Skin is not jaundiced or pale.  Neurological:     General: No focal deficit present.     Mental Status: He is alert and oriented to person, place, and time.  Psychiatric:        Behavior: Behavior normal.     UC Treatments / Results  Labs (all labs ordered are listed, but only abnormal results are displayed) Labs Reviewed   SARS CORONAVIRUS 2 (TAT 6-24 HRS)    EKG   Radiology No results found.  Procedures Procedures (including critical care time)  Medications Ordered in UC Medications - No data to display  Initial Impression / Assessment and Plan / UC Course  I have reviewed the triage vital signs and the nursing notes.  Pertinent labs & imaging results that were available during my care of the patient were reviewed by me and considered in my medical decision making (see chart for details).     Work note done to clear to return on Wednesday, February 8.  Swab also done to confirm for employer Final Clinical Impressions(s) / UC Diagnoses   Final diagnoses:  Acute upper respiratory infection     Discharge Instructions       You have been swabbed for COVID, and the test will result in the next 24 hours. Our staff will call you if positive. If the test is positive, you should quarantine for 5 days.      ED Prescriptions   None    PDMP not reviewed this encounter.   Barrett Henle, MD 10/02/21 1408    Barrett Henle, MD 10/02/21 1409

## 2022-12-03 ENCOUNTER — Ambulatory Visit (INDEPENDENT_AMBULATORY_CARE_PROVIDER_SITE_OTHER): Payer: Self-pay

## 2022-12-03 ENCOUNTER — Ambulatory Visit
Admission: EM | Admit: 2022-12-03 | Discharge: 2022-12-03 | Disposition: A | Discharge status: Home / Self Care | Payer: Medicaid Other

## 2022-12-03 DIAGNOSIS — M62830 Muscle spasm of back: Secondary | ICD-10-CM

## 2022-12-03 DIAGNOSIS — M546 Pain in thoracic spine: Secondary | ICD-10-CM

## 2022-12-03 DIAGNOSIS — M545 Low back pain, unspecified: Secondary | ICD-10-CM

## 2022-12-03 MED ORDER — CYCLOBENZAPRINE HCL 5 MG PO TABS: 5.0000 mg | ORAL_TABLET | Freq: Every evening | ORAL | 0 refills | Status: AC | PRN

## 2022-12-03 MED ORDER — PREDNISONE 20 MG PO TABS: ORAL_TABLET | ORAL | 0 refills | Status: AC

## 2022-12-03 NOTE — ED Triage Notes (Signed)
Pt reports low abdominal pain and low back pain on and off x 7 years. Pt reports he is taking Aleve, gives some relief.    Pt requested work note.

## 2022-12-03 NOTE — ED Provider Notes (Signed)
[facility]  Note:  This document was prepared using Conservation officer, historic buildings and may include unintentional dictation errors.  MRN: [id number] DOB: 1995/11/01  Subjective:   John Middleton is a 27 y.o. male presenting for 7-year history of acute on chronic mid to low back pain.  Patient reports stiffness, tightness in his back. No fall, trauma, numbness or tingling, saddle paresthesia, changes to bowel or urinary habits, radicular symptoms.  Has been using Aleve with some relief.  Has never had imaging, seeing a specialist for this.  Needs a note for work.  No current facility-administered medications for this encounter.  Current Outpatient Medications:    naproxen sodium (ALEVE) 220 MG tablet, Take 220 mg by mouth., Disp: , Rfl:    NON FORMULARY, Myo-inositol, Disp: , Rfl:    NON FORMULARY, [facility]'s Estrolibrium, Disp: , Rfl:    Homeopathic Products ([facility] STYE EYE RELIEF OP), Apply 1 application to eye as needed (for eye irritation left eye)., Disp: , Rfl:    Allergies  Allergen Reactions   Food Hives    Many: apple, watermelon, peach, corn, corn starch, cherries REACTION: ANAPHYLAXIS, soy, soy products, almonds, peanuts, hazel nuts, celery, carrots.    Soy Allergy Anaphylaxis   Ibuprofen Other (See Comments)    migraine    Past Medical History:  Diagnosis Date   Abdominal pain, recurrent    ADHD (attention deficit hyperactivity disorder)    Allergy    Anxiety    Asthma    Gastroesophageal reflux    Headache(784.0)    Takes Neurontin for miagraines   Vision abnormalities    Wears glasses for reading     History reviewed. No pertinent surgical history.  History reviewed. No pertinent family history.  Social History   Tobacco Use   Smoking status: Former    Packs/day: .2    Types: Cigarettes   Smokeless tobacco: Never  Vaping Use   Vaping Use: Every day  Substance Use Topics   Alcohol use: Yes    Comment: rarely    Drug use: No    ROS   Objective:   Vitals: BP 113/71 (BP Location: Right Arm)   Pulse 63   Temp 98.2 F (36.8 C) (Oral)   Resp 16   SpO2 98%   Physical Exam Constitutional:      General: He is not in acute distress.    Appearance: Normal appearance. He is well-developed and normal weight. He is not ill-appearing, toxic-appearing or diaphoretic.  HENT:     Head: Normocephalic and atraumatic.     Right Ear: External ear normal.     Left Ear: External ear normal.     Nose: Nose normal.     Mouth/Throat:     Pharynx: Oropharynx is clear.  Eyes:     General: No scleral icterus.       Right eye: No discharge.        Left eye: No discharge.     Extraocular Movements: Extraocular movements intact.  Cardiovascular:     Rate and Rhythm: Normal rate.  Pulmonary:     Effort: Pulmonary effort is normal.  Musculoskeletal:     Cervical back: Normal range of motion.     Thoracic back: Spasms and tenderness present. No swelling, edema, deformity, signs of trauma, lacerations or bony tenderness. Normal range of motion. No scoliosis.     Lumbar back: Spasms, tenderness and bony tenderness present. No swelling, edema, deformity, signs of trauma or lacerations.  Normal range of motion. Negative right straight leg raise test and negative left straight leg raise test. No scoliosis.       Back:  Neurological:     Mental Status: He is alert and oriented to person, place, and time.  Psychiatric:        Mood and Affect: Mood normal.        Behavior: Behavior normal.        Thought Content: Thought content normal.        Judgment: Judgment normal.     Assessment and Plan :   PDMP not reviewed this encounter.  1. Acute bilateral thoracic back pain   2. Acute bilateral low back pain without sciatica   3. Muscle spasm of back     Given severity of his pain, recommended an oral prednisone course, muscle relaxant.  X-ray over-read was pending at time of discharge, recommended follow up  with only abnormal results. Otherwise will not call for negative over-read. Patient was in agreement.  Recommend follow-up with the spine clinic.  Counseled patient on potential for adverse effects with medications prescribed/recommended today, ER and return-to-clinic precautions discussed, patient verbalized understanding.    Wallis Bamberg, [place] 12/04/22 [phone]

## 2023-02-04 ENCOUNTER — Ambulatory Visit
Admission: EM | Admit: 2023-02-04 | Discharge: 2023-02-04 | Disposition: A | Discharge status: Home / Self Care | Payer: Self-pay | Attending: Emergency Medicine | Admitting: Emergency Medicine

## 2023-02-04 DIAGNOSIS — R519 Headache, unspecified: Secondary | ICD-10-CM

## 2023-02-04 DIAGNOSIS — R110 Nausea: Secondary | ICD-10-CM

## 2023-02-04 MED ORDER — KETOROLAC TROMETHAMINE 30 MG/ML IJ SOLN
30.0000 mg | Freq: Once | INTRAMUSCULAR | Status: AC
  Administered 2023-02-04: 30 mg via INTRAMUSCULAR

## 2023-02-04 MED ORDER — ONDANSETRON 4 MG PO TBDP
4.0000 mg | ORAL_TABLET | Freq: Once | ORAL | Status: AC
  Administered 2023-02-04: 4 mg via ORAL

## 2023-02-04 NOTE — Discharge Instructions (Signed)
Please talk with your regular health provider about your nausea.   After you leave here, go rest in a dark room. Your headache should be better after that.

## 2023-02-04 NOTE — ED Triage Notes (Signed)
Pt presents to UC w/ c/o extreme dizziness, fatigue for 3 days with addition of headache this morning. Pt states he feel "foggy brained" Pt states dark room and tylenol did not help with pain. Hx migraines as a child.

## 2023-02-04 NOTE — ED Provider Notes (Signed)
[facility]    CSN: [id number] Arrival date & time: 02/04/23  1057      History   Chief Complaint No chief complaint on file.   HPI John Middleton is a 27 y.o. male. He reports a migraine today. Feels like typical migraine. Tylenol isn't helping. Associated with nausea but no vomiting. Pt initially says he gets nausea with migraines and then reports he frequently has nausea that he thinks may be leftover from hx of eating disorder. Denies change in vision  HPI  Past Medical History:  Diagnosis Date   Abdominal pain, recurrent    ADHD (attention deficit hyperactivity disorder)    Allergy    Anxiety    Asthma    Gastroesophageal reflux    Headache(784.0)    Takes Neurontin for miagraines   Vision abnormalities    Wears glasses for reading    Patient Active Problem List   Diagnosis Date Noted   Renal insufficiency 09/01/2013   Suicidal ideation 08/31/2013   Tylenol overdose 08/31/2013   Heart murmur 08/31/2013   Stuttering 01/29/2013   MDD (major depressive disorder), recurrent, severe, with psychosis ([facility]) 01/24/2013   Generalized anxiety disorder 01/24/2013   Vomiting 09/04/2011   Generalized abdominal pain    Gastroesophageal reflux     History reviewed. No pertinent surgical history.     Home Medications    Prior to Admission medications   Medication Sig Start Date End Date Taking? Authorizing Provider  cyclobenzaprine (FLEXERIL) 5 MG tablet Take 1 tablet (5 mg total) by mouth at bedtime as needed for muscle spasms. 12/03/22   Wallis Bamberg, PA-C  Homeopathic Products [facility] STYE EYE RELIEF OP) Apply 1 application to eye as needed (for eye irritation left eye).    [provider]  naproxen sodium (ALEVE) 220 MG tablet Take 220 mg by mouth.    [provider]  NON FORMULARY Myo-inositol    [provider]  NON FORMULARY [facility]'s Estrolibrium    [provider]  predniSONE (DELTASONE) 20 MG tablet Take 2  tablets daily with breakfast. 12/03/22   Wallis Bamberg, PA-C    Family History History reviewed. No pertinent family history.  Social History Social History   Tobacco Use   Smoking status: Former    Packs/day: .2    Types: Cigarettes   Smokeless tobacco: Never  Vaping Use   Vaping Use: Every day  Substance Use Topics   Alcohol use: Yes    Comment: rarely   Drug use: No     Allergies   Food, Soy allergy, and Ibuprofen   Review of Systems Review of Systems   Physical Exam Triage Vital Signs ED Triage Vitals  Enc Vitals Group     BP 02/04/23 1114 125/69     Pulse Rate 02/04/23 1114 (!) 54     Resp 02/04/23 1114 16     Temp 02/04/23 1114 98.3 F (36.8 C)     Temp Source 02/04/23 1114 Oral     SpO2 02/04/23 1114 99 %     Weight --      Height --      Head Circumference --      Peak Flow --      Pain Score 02/04/23 1116 9     Pain Loc --      Pain Edu? --      Excl. in [place]? --    No data found.  Updated Vital Signs BP 125/69 (BP Location: Right Arm)  Pulse (!) 54   Temp 98.3 F (36.8 C) (Oral)   Resp 16   SpO2 99%   Visual Acuity Right Eye Distance:   Left Eye Distance:   Bilateral Distance:    Right Eye Near:   Left Eye Near:    Bilateral Near:     Physical Exam Constitutional:      General: He is in acute distress.     Appearance: Normal appearance. He is not ill-appearing.  Cardiovascular:     Rate and Rhythm: Normal rate and regular rhythm.  Pulmonary:     Effort: Pulmonary effort is normal.     Breath sounds: Normal breath sounds.  Abdominal:     General: Abdomen is flat. Bowel sounds are normal.     Palpations: Abdomen is soft.     Tenderness: There is no abdominal tenderness. There is no guarding or rebound.  Neurological:     Mental Status: He is alert and oriented to person, place, and time.      UC Treatments / Results  Labs (all labs ordered are listed, but only abnormal results are displayed) Labs Reviewed - No data to  display  EKG   Radiology No results found.  Procedures Procedures (including critical care time)  Medications Ordered in UC Medications  ketorolac (TORADOL) 30 MG/ML injection 30 mg (30 mg Intramuscular Given 02/04/23 1158)  ondansetron (ZOFRAN-ODT) disintegrating tablet 4 mg (4 mg Oral Given 02/04/23 1158)    Initial Impression / Assessment and Plan / UC Course  I have reviewed the triage vital signs and the nursing notes.  Pertinent labs & imaging results that were available during my care of the patient were reviewed by me and considered in my medical decision making (see chart for details).    Advised pt to talk with PCP about chronic nausea. Given toradol and zofran for migraine.   Final Clinical Impressions(s) / UC Diagnoses   Final diagnoses:  Acute nonintractable headache, unspecified headache type  Chronic nausea     Discharge Instructions      Please talk with your regular health provider about your nausea.   After you leave here, go rest in a dark room. Your headache should be better after that.    ED Prescriptions   None    PDMP not reviewed this encounter.   Cathlyn Parsons, NP 02/04/23 [phone]
